# Patient Record
Sex: Female | Born: 1975
Health system: Southern US, Community
[De-identification: ages and names within clinical notes are randomized; demographics above are authoritative.]

## PROBLEM LIST (undated history)

## (undated) DIAGNOSIS — T7840XA Allergy, unspecified, initial encounter: Secondary | ICD-10-CM

## (undated) DIAGNOSIS — N200 Calculus of kidney: Secondary | ICD-10-CM

## (undated) DIAGNOSIS — M5030 Other cervical disc degeneration, unspecified cervical region: Secondary | ICD-10-CM

## (undated) DIAGNOSIS — K219 Gastro-esophageal reflux disease without esophagitis: Secondary | ICD-10-CM

## (undated) DIAGNOSIS — K921 Melena: Secondary | ICD-10-CM

## (undated) DIAGNOSIS — G43909 Migraine, unspecified, not intractable, without status migrainosus: Secondary | ICD-10-CM

## (undated) DIAGNOSIS — Z8619 Personal history of other infectious and parasitic diseases: Secondary | ICD-10-CM

## (undated) HISTORY — PX: TUBAL LIGATION: SHX77

## (undated) HISTORY — PX: ENDOVENOUS ABLATION SAPHENOUS VEIN W/ LASER: SUR449

## (undated) HISTORY — DX: Personal history of other infectious and parasitic diseases: Z86.19

## (undated) HISTORY — DX: Melena: K92.1

## (undated) HISTORY — DX: Migraine, unspecified, not intractable, without status migrainosus: G43.909

## (undated) HISTORY — DX: Allergy, unspecified, initial encounter: T78.40XA

## (undated) HISTORY — DX: Gastro-esophageal reflux disease without esophagitis: K21.9

## (undated) HISTORY — DX: Calculus of kidney: N20.0

## (undated) HISTORY — DX: Other cervical disc degeneration, unspecified cervical region: M50.30

---

## 2014-09-12 LAB — HM PAP SMEAR: HM PAP: NORMAL

## 2015-01-16 ENCOUNTER — Encounter: Payer: Self-pay | Admitting: Family Medicine

## 2015-01-16 ENCOUNTER — Ambulatory Visit (INDEPENDENT_AMBULATORY_CARE_PROVIDER_SITE_OTHER): Payer: BLUE CROSS/BLUE SHIELD | Admitting: Family Medicine

## 2015-01-16 VITALS — BP 118/80 | HR 92 | Temp 97.8°F | Ht 63.25 in | Wt 160.5 lb

## 2015-01-16 DIAGNOSIS — Z7189 Other specified counseling: Secondary | ICD-10-CM | POA: Diagnosis not present

## 2015-01-16 DIAGNOSIS — N926 Irregular menstruation, unspecified: Secondary | ICD-10-CM

## 2015-01-16 DIAGNOSIS — K649 Unspecified hemorrhoids: Secondary | ICD-10-CM | POA: Diagnosis not present

## 2015-01-16 DIAGNOSIS — J309 Allergic rhinitis, unspecified: Secondary | ICD-10-CM

## 2015-01-16 DIAGNOSIS — Z7689 Persons encountering health services in other specified circumstances: Secondary | ICD-10-CM

## 2015-01-16 MED ORDER — HYDROCORTISONE 2.5 % RE CREA: 1.0000 "application " | TOPICAL_CREAM | Freq: Two times a day (BID) | RECTAL | Status: DC

## 2015-01-16 NOTE — Progress Notes (Signed)
Pre visit review using our clinic review tool, if applicable. No additional management support is needed unless otherwise documented below in the visit note. 

## 2015-01-16 NOTE — Progress Notes (Signed)
HPI:  Danielle Reyes is here to establish care.  Last PCP and physical: sees Danielle Reyes in gyn for annual exam.  Has the following chronic problems that require follow up and concerns today:  Hemorrhoids: -she has had 4 children and has had mild symptoms in the past -flare started 1 week ago -painful hemorrhoid with some bleeding -she has had mild constipation from time to time -tried preparation H yesterday  Allergic Rhinitis: -chronic -takes claritin -nasal congestion, pnd, sneezing -denies: wheezing, asthma  ROS negative for unless reported above: fevers, unintentional weight loss, hearing or vision loss, chest pain, palpitations, struggling to breath, hemoptysis, melena, hematochezia, hematuria, falls, loc, si, thoughts of self harm  Past Medical History  Diagnosis Date  . Blood in stool     hemorrhoids  . History of chicken pox   . Allergy   . GERD (gastroesophageal reflux disease)   . Kidney stones   . Migraines     Past Surgical History  Procedure Laterality Date  . Tubal ligation    . Cesarean section    . Endovenous ablation saphenous vein w/ laser Left     Family History  Problem Relation Age of Onset  . Alcoholism Maternal Grandfather   . Heart disease Paternal Grandfather   . Kidney disease Mother   . Mental illness Father     History   Social History  . Marital Status: Married    Spouse Name: N/A  . Number of Children: N/A  . Years of Education: N/A   Social History Main Topics  . Smoking status: Never Smoker   . Smokeless tobacco: Not on file  . Alcohol Use: Not on file  . Drug Use: Not on file  . Sexual Activity: Not on file   Other Topics Concern  . None   Social History Narrative   Work or School: homemaker      Home Situation: lives with 4 kids 20, 14, 10, 6 and husband      Spiritual Beliefs: Christian       Lifestyle: doing elliptical, 10,000 steps, watching diet           Current outpatient prescriptions:  .   hydrocortisone (ANUSOL-HC) 2.5 % rectal cream, Place 1 application rectally 2 (two) times daily., Disp: 30 g, Rfl: 0 .  [facility] 1/[facility] 1-35 MG-MCG tablet, Take 1 tablet by mouth daily., Disp: , Rfl: 3  EXAM:  Filed Vitals:   01/16/15 0823  BP: 118/80  Pulse: 92  Temp: 97.8 F (36.6 C)    Body mass index is 28.19 kg/(m^2).  GENERAL: vitals reviewed and listed above, alert, oriented, appears well hydrated and in no acute distress  HEENT: atraumatic, conjunttiva clear, no obvious abnormalities on inspection of external nose and ears  NECK: no obvious masses on inspection  LUNGS: clear to auscultation bilaterally, no wheezes, rales or rhonchi, good air movement  CV: HRRR, no peripheral edema  RECTAL: declined  MS: moves all extremities without noticeable abnormality  PSYCH: pleasant and cooperative, no obvious depression or anxiety  ASSESSMENT AND PLAN:  Discussed the following assessment and plan:  Hemorrhoids, unspecified hemorrhoid type - Plan: hydrocortisone (ANUSOL-HC) 2.5 % rectal cream -she declined exam today, is going to try anusol, sitz baths and keeping stool soft, agreed to f/u if persists or worsens  Menstrual periods irregular -sees gyn  Allergic rhinitis, unspecified allergic rhinitis type  Encounter to establish care -We reviewed the PMH, PSH, FH, SH, Meds and Allergies. -We provided refills  for any medications we will prescribe as needed. -We addressed current concerns per orders and patient instructions. -We have asked for records for pertinent exams, studies, vaccines and notes from previous providers. -We have advised patient to follow up per instructions below.  Mentioned back pain as leaving - chronic, mild, sees chiropractor. HEP given and advised to RTC if not improving/resolved in 1 month or worsening.  -Patient advised to return or notify a doctor immediately if symptoms worsen or persist or new concerns arise.  There are no Patient  Instructions on file for this visit.   Kriste Basque R.

## 2015-01-16 NOTE — Patient Instructions (Signed)
BEFORE YOU LEAVE: -CPE in 3 months - come fasting  For the hemorhoids - we sent a cream to your pharmacy - please call if symptoms persist in 2 weeks or if worsening or concerns. Keep bowel soft - use mirilax once daily if needed and a daily fiber supplement.  We recommend the following healthy lifestyle measures: - eat a healthy diet consisting of lots of vegetables, fruits, beans, nuts, seeds, healthy meats such as white chicken and fish and whole grains.  - avoid fried foods, fast food, processed foods, sodas, red meet and other fattening foods.  - get a least 150 minutes of aerobic exercise per week.   About Hemorrhoids  Hemorrhoids are swollen veins in the lower rectum and anus.  Also called piles, hemorrhoids are a common problem.  Hemorrhoids may be internal (inside the rectum) or external (around the anus).  Internal Hemorrhoids  Internal hemorrhoids are often painless, but they rarely cause bleeding.  The internal veins may stretch and fall down (prolapse) through the anus to the outside of the body.  The veins may then become irritated and painful.  External Hemorrhoids  External hemorrhoids can be easily seen or felt around the anal opening.  They are under the skin around the anus.  When the swollen veins are scratched or broken by straining, rubbing or wiping they sometimes bleed.  How Hemorrhoids Occur  Veins in the rectum and around the anus tend to swell under pressure.  Hemorrhoids can result from increased pressure in the veins of your anus or rectum.  Some sources of pressure are:   Straining to have a bowel movement because of constipation  Waiting too long to have a bowel movement  Coughing and sneezing often  Sitting for extended periods of time, including on the toilet  Diarrhea  Obesity  Trauma or injury to the anus  Some liver diseases  Stress  Family history of hemorrhoids  Pregnancy  Pregnant women should try to avoid becoming constipated,  because they are more likely to have hemorrhoids during pregnancy.  In the last trimester of pregnancy, the enlarged uterus may press on blood vessels and causes hemorrhoids.  In addition, the strain of childbirth sometimes causes hemorrhoids after the birth.  Symptoms of Hemorrhoids  Some symptoms of hemorrhoids include:  Swelling and/or a tender lump around the anus  Itching, mild burning and bleeding around the anus  Painful bowel movements with or without constipation  Bright red blood covering the stool, on toilet paper or in the toilet bowel.   Symptoms usually go away within a few days.  Always talk to your doctor about any bleeding to make sure it is not from some other causes.  Diagnosing and Treating Hemorrhoids  Diagnosis is made by an examination by your healthcare provider.  Special test can be performed by your doctor.    Most cases of hemorrhoids can be treated with:  High-fiber diet: Eat more high-fiber foods, which help prevent constipation.  Ask for more detailed fiber information on types and sources of fiber from your healthcare provider.  Fluids: Drink plenty of water.  This helps soften bowel movements so they are easier to pass.  Sitz baths and cold packs: Sitting in lukewarm water two or three times a day for 15 minutes cleases the anal area and may relieve discomfort.  If the water is too hot, swelling around the anus will get worse.  Placing a cloth-covered ice pack on the anus for ten minutes four times  a day can also help reduce selling.  Gently pushing a prolapsed hemorrhoid back inside after the bath or ice pack can be helpful.  Medications: For mild discomfort, your healthcare provider may suggest over-the-counter pain medication or prescribe a cream or ointment for topical use.  The cream may contain witch hazel, zinc oxide or petroleum jelly.  Medicated suppositories are also a treatment option.  Always consult your doctor before applying medications or  creams.  Procedures and surgeries: There are also a number of procedures and surgeries to shrink or remove hemorrhoids in more serious cases.  Talk to your physician about these options.  You can often prevent hemorrhoids or keep them from becoming worse by maintaining a healthy lifestyle.  Eat a fiber-rich diet of fruits, vegetables and whole grains.  Also, drink plenty of water and exercise regularly.   2007, [employer] Doc.30

## 2015-01-18 ENCOUNTER — Ambulatory Visit (INDEPENDENT_AMBULATORY_CARE_PROVIDER_SITE_OTHER): Payer: BLUE CROSS/BLUE SHIELD | Admitting: Family Medicine

## 2015-01-18 ENCOUNTER — Encounter: Payer: Self-pay | Admitting: Family Medicine

## 2015-01-18 VITALS — BP 102/60 | HR 77 | Temp 97.5°F | Ht 63.25 in | Wt 157.0 lb

## 2015-01-18 DIAGNOSIS — M542 Cervicalgia: Secondary | ICD-10-CM

## 2015-01-18 MED ORDER — CYCLOBENZAPRINE HCL 5 MG PO TABS: 5.0000 mg | ORAL_TABLET | Freq: Every evening | ORAL | Status: AC | PRN
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
## 2015-01-18 NOTE — Patient Instructions (Signed)
BEFORE YOU LEAVE: -neck spasm exercises -follow up in 1 month; sooner if worsening or other concerns -xray sheet  Do the exercises at least 4 days per week  Use the muscle relaxer (flexeril) once nightly before bed for 5-7 days  Tylenol 500-1000mg  up to 3 times per day

## 2015-01-18 NOTE — Progress Notes (Signed)
Pre visit review using our clinic review tool, if applicable. No additional management support is needed unless otherwise documented below in the visit note. 

## 2015-01-20 ENCOUNTER — Encounter: Payer: Self-pay | Admitting: Family Medicine

## 2015-01-21 ENCOUNTER — Other Ambulatory Visit: Payer: Self-pay | Admitting: Family Medicine

## 2015-01-21 ENCOUNTER — Ambulatory Visit (INDEPENDENT_AMBULATORY_CARE_PROVIDER_SITE_OTHER)
Admission: RE | Admit: 2015-01-21 | Discharge: 2015-01-21 | Disposition: A | Discharge status: Home / Self Care | Payer: BLUE CROSS/BLUE SHIELD | Source: Ambulatory Visit | Attending: Family Medicine | Admitting: Family Medicine

## 2015-01-21 DIAGNOSIS — M542 Cervicalgia: Secondary | ICD-10-CM

## 2015-01-21 DIAGNOSIS — M5030 Other cervical disc degeneration, unspecified cervical region: Secondary | ICD-10-CM

## 2015-01-21 IMAGING — CR DG CERVICAL SPINE COMPLETE 4+V
5 series · 5 of 5 positions shown · non-contrast
Comparison: None

CLINICAL DATA: Neck pain

EXAM:
CERVICAL SPINE  4+ VIEWS

[view not recorded (1 of 5)]
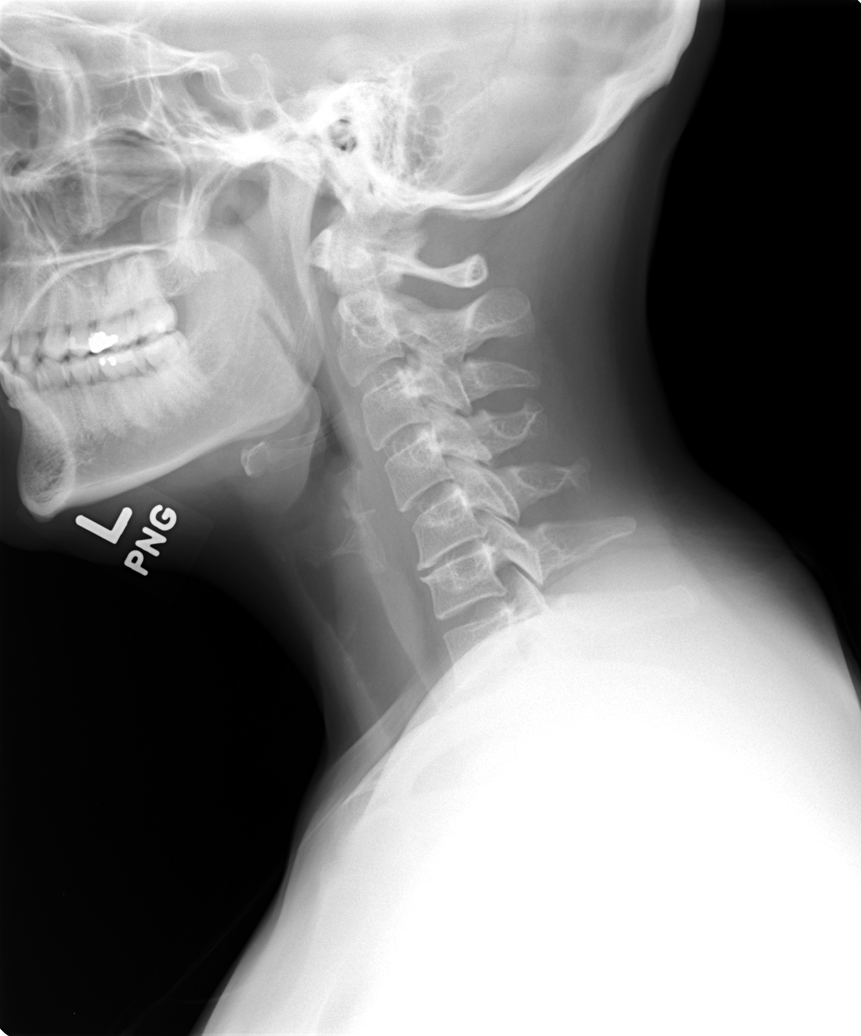

[view not recorded (2 of 5)]
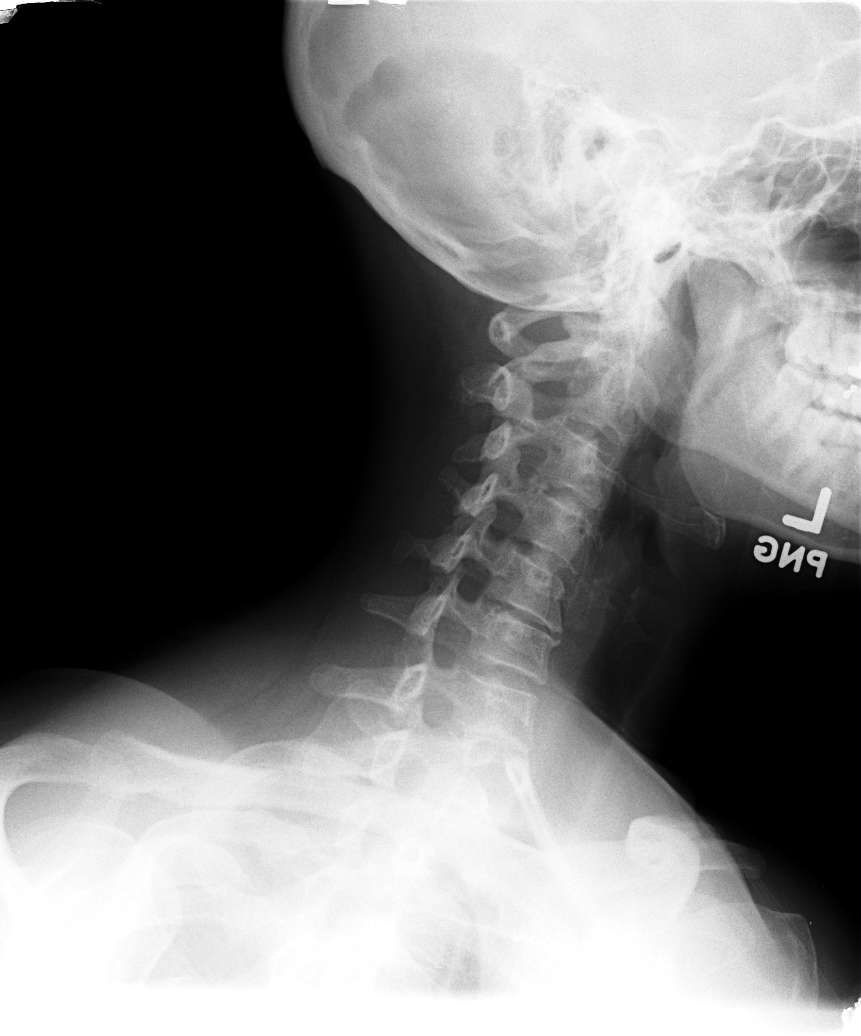

[view not recorded (3 of 5)]
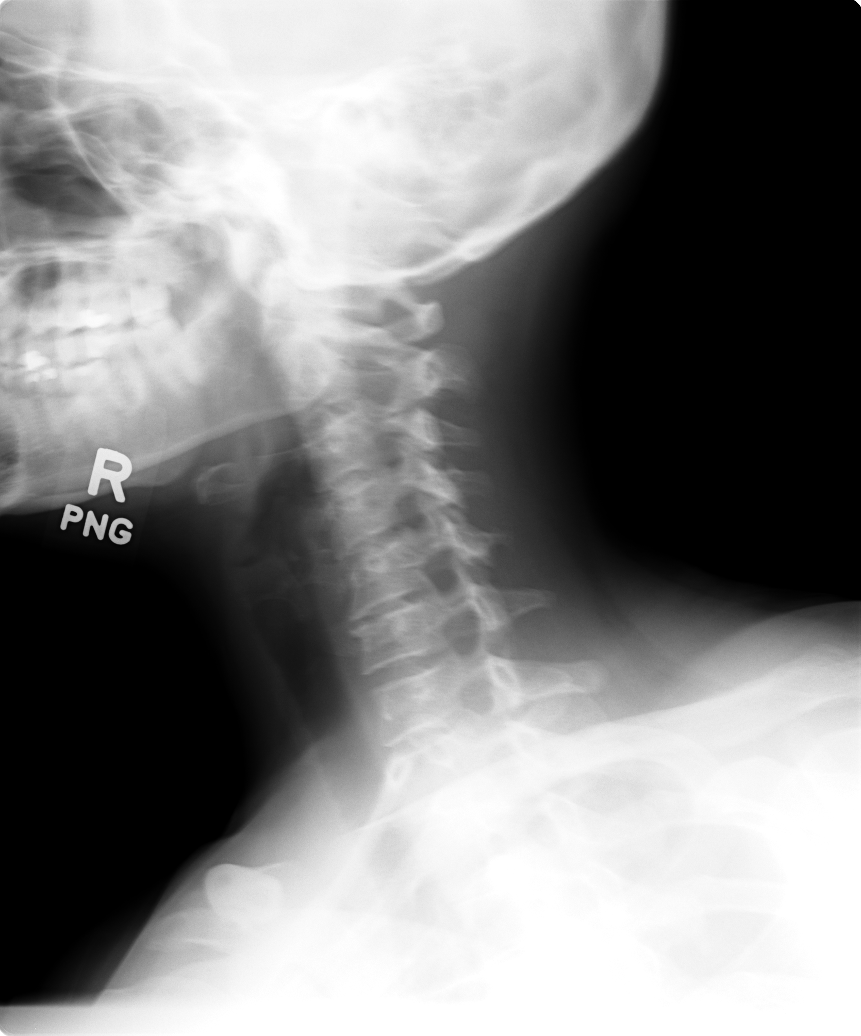

[view not recorded (4 of 5)]
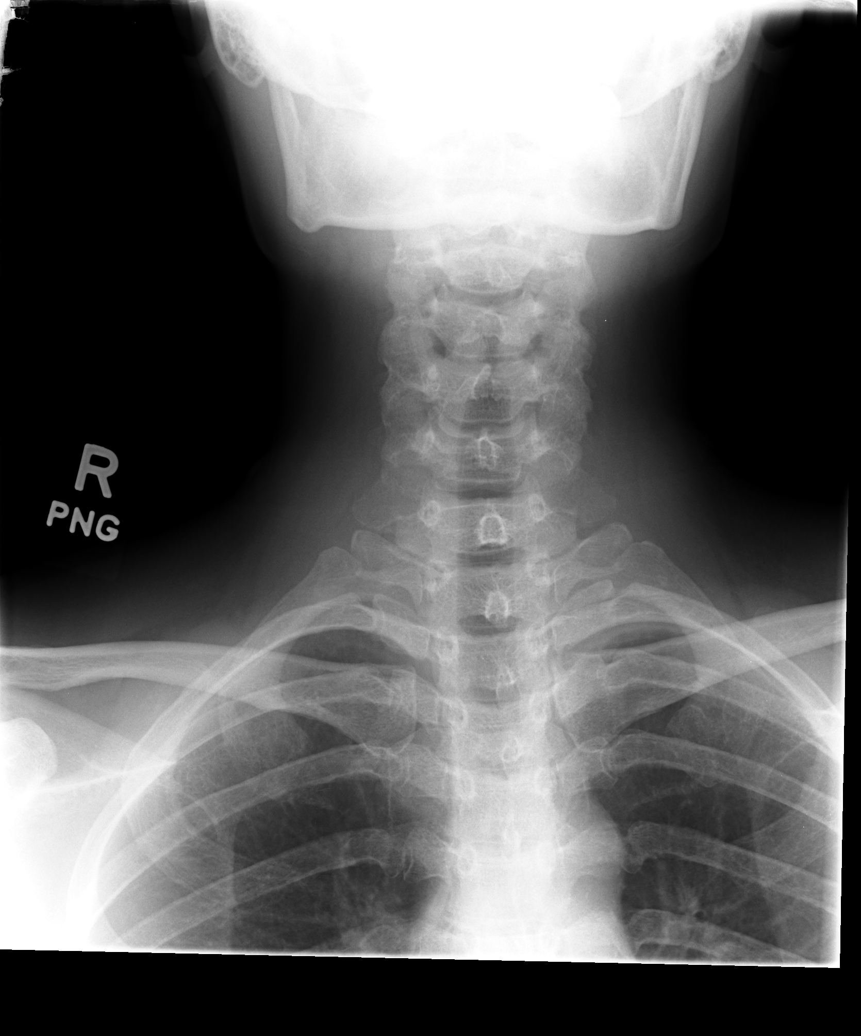

[view not recorded (5 of 5)]
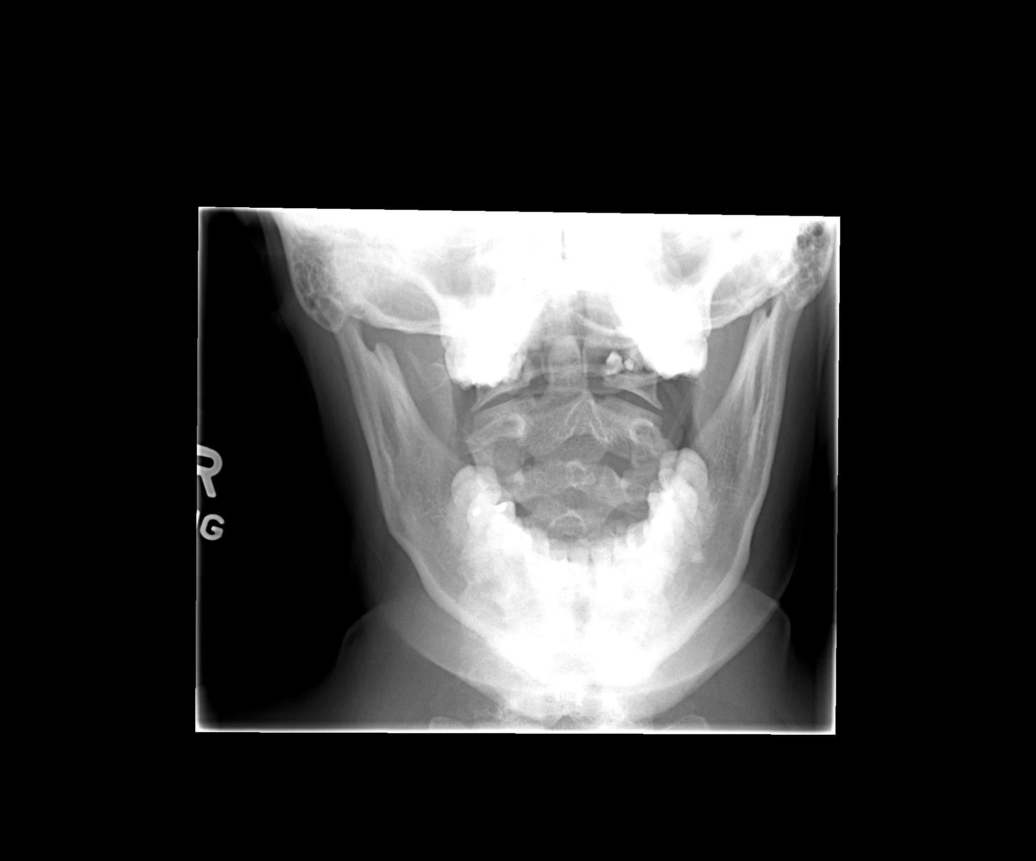

[5 of 5 positions shown; findings below may reference images not displayed]

FINDINGS: Reversal of cervical lordosis question muscle spasm.

Prevertebral soft tissues normal thickness.

Osseous mineralization grossly normal.

Disc space narrowing with endplate spur formation C5-C6.

Vertebral body and disc space heights otherwise maintained.

No acute fracture, subluxation or bone destruction.

Motion artifacts degrade RPO view.

Lung apices clear.

C1-C2 alignment normal.
IMPRESSION: Degenerative disc disease changes C5-C6.

Question muscle spasm.

Otherwise negative exam.

## 2015-01-21 NOTE — Progress Notes (Signed)
[phone] (home)  Discussed results xray with pt, [facility] - not [facility] better, not worse. No weakness or numbness, but does have radiating pain. Likely mild cervical radiculopathy. Discussed tx options for this. She wants referral to specialist. Sent. Also discussed urgent and emergency precautions.

## 2015-01-22 ENCOUNTER — Encounter: Payer: Self-pay | Admitting: Family Medicine

## 2015-01-23 ENCOUNTER — Encounter: Payer: Self-pay | Admitting: Family Medicine

## 2015-02-10 HISTORY — PX: CERVICAL SPINE SURGERY: SHX589

## 2015-02-18 ENCOUNTER — Ambulatory Visit: Payer: BLUE CROSS/BLUE SHIELD | Admitting: Family Medicine

## 2015-03-26 ENCOUNTER — Ambulatory Visit: Payer: Self-pay | Admitting: Family Medicine

## 2015-04-30 ENCOUNTER — Encounter: Payer: Self-pay | Admitting: Family Medicine

## 2015-04-30 ENCOUNTER — Ambulatory Visit (INDEPENDENT_AMBULATORY_CARE_PROVIDER_SITE_OTHER): Payer: BLUE CROSS/BLUE SHIELD | Admitting: Family Medicine

## 2015-04-30 VITALS — BP 110/80 | HR 112 | Temp 98.4°F | Ht 63.5 in | Wt 167.2 lb

## 2015-04-30 DIAGNOSIS — E663 Overweight: Secondary | ICD-10-CM | POA: Diagnosis not present

## 2015-04-30 DIAGNOSIS — Z0000 Encounter for general adult medical examination without abnormal findings: Secondary | ICD-10-CM

## 2015-04-30 NOTE — Progress Notes (Signed)
Pre visit review using our clinic review tool, if applicable. No additional management support is needed unless otherwise documented below in the visit note. 

## 2015-04-30 NOTE — Patient Instructions (Signed)
BEFORE YOU LEAVE: - schedule lab appointment -flu shot in the late summer or early fall - follow up yearly  -We have ordered labs or studies at this visit. It can take up to 1-2 weeks for results and processing. We will contact you with instructions IF your results are abnormal. Normal results will be released to your [facility]. If you have not heard from us or can not find your results in [facility] in 2 weeks please contact our office.  We recommend the following healthy lifestyle measures: - eat a healthy diet consisting of lots of vegetables, fruits, beans, nuts, seeds, healthy meats such as white chicken and fish and whole grains.  - avoid fried foods, fast food, processed foods, sodas, red meet and other fattening foods.  - get a least 150 minutes of aerobic exercise per week.

## 2015-04-30 NOTE — Progress Notes (Signed)
HPI:  Here for CPE:  -Concerns and/or follow up today: none  -Diet: variety of foods, balance and well rounded, larger portion sizes  -Exercise: no regular exercise  -Taking folic acid, vitamin D or calcium: no  -Diabetes and Dyslipidemia Screening: Not Fasting today  -Hx of HTN: no  -Vaccines: UTD  -pap history: sees Merry Lofty yearly for gyn physical and breast exam  -FDLMP: normal, regular  -sexual activity: yes, female partner, no new partners  -wants STI testing (Hep C if born [birth year]):  no  -FH breast, colon or ovarian ca: see FH Last mammogram: n/a Last colon cancer screening: n/a  -Alcohol, Tobacco, drug use: see social history  Review of Systems - no fevers, unintentional weight loss, vision loss, hearing loss, chest pain, sob, hemoptysis, melena, hematochezia, hematuria, genital discharge, changing or concerning skin lesions, bleeding, bruising, loc, thoughts of self harm or SI  Past Medical History  Diagnosis Date  . Blood in stool     hemorrhoids  . History of chicken pox   . Allergy   . GERD (gastroesophageal reflux disease)   . Kidney stones   . Migraines   . DDD (degenerative disc disease), cervical     s/p surgery in 2016 with Dr. Dutch Quint    Past Surgical History  Procedure Laterality Date  . Tubal ligation    . Cesarean section    . Endovenous ablation saphenous vein w/ laser Left   . Cervical spine surgery  02/2015    Dr Tomasa Blase patient    Family History  Problem Relation Age of Onset  . Alcoholism Maternal Grandfather   . Heart disease Paternal Grandfather   . Kidney disease Mother   . Mental illness Father     History   Social History  . Marital Status: Married    Spouse Name: N/A  . Number of Children: N/A  . Years of Education: N/A   Social History Main Topics  . Smoking status: Never Smoker   . Smokeless tobacco: Not on file  . Alcohol Use: Not on file  . Drug Use: Not on file  . Sexual Activity: Not on file   Other  Topics Concern  . None   Social History Narrative   Work or School: homemaker      Home Situation: lives with 4 kids 20, 14, 10, 6 and husband      Spiritual Beliefs: Christian       Lifestyle: doing elliptical, 10,000 steps, watching diet           Current outpatient prescriptions:  .  cyclobenzaprine (FLEXERIL) 5 MG tablet, Take 1 tablet (5 mg total) by mouth at bedtime as needed for muscle spasms., Disp: 20 tablet, Rfl: 0 .  diclofenac (VOLTAREN) 75 MG EC tablet, TAKE 1 TABLET BY MOUTH TWICE A DAY FOR 2 WEEKS WITH FOOD THEN AS NEEDED, Disp: , Rfl: 2 .  [facility] 1/35 1-35 MG-MCG tablet, Take 1 tablet by mouth daily., Disp: , Rfl: 3  EXAM:  Filed Vitals:   04/30/15 1117  BP: 110/80  Pulse: 112  Temp: 98.4 F (36.9 C)   Body mass index is 29.15 kg/(m^2).  GENERAL: vitals reviewed and listed below, alert, oriented, appears well hydrated and in no acute distress  HEENT: head atraumatic, PERRLA, normal appearance of eyes, ears, nose and mouth. moist mucus membranes.  NECK: supple, no masses or lymphadenopathy  LUNGS: clear to auscultation bilaterally, no rales, rhonchi or wheeze  CV: HRRR, no peripheral edema or cyanosis,  normal pedal pulses  BREAST: declined - does with gyn  ABDOMEN: bowel sounds normal, soft, non tender to palpation, no masses, no rebound or guarding  GU: declined - does with gyn  SKIN: no rash or abnormal lesions  MS: normal gait, moves all extremities normally  NEURO: CN II-XII grossly intact, normal muscle strength and sensation to light touch on extremities  PSYCH: normal affect, pleasant and cooperative  ASSESSMENT AND PLAN:  Discussed the following assessment and plan:  There are no diagnoses linked to this encounter.  -Discussed and advised all [place] preventive services health task force level A and B recommendations for age, sex and risks.  -Advised at least 150 minutes of exercise per week and a healthy diet low in saturated fats and  sweets and consisting of fresh fruits and vegetables, lean meats such as fish and white chicken and whole grains.  -labs, studies and vaccines per orders this encounter  Orders Placed This Encounter  Procedures  . Lipid Panel    Standing Status: Future     Number of Occurrences:      Standing Expiration Date: 06/25/2015  . Hemoglobin A1C    Standing Status: Future     Number of Occurrences:      Standing Expiration Date: 06/25/2015  . TSH    Standing Status: Future     Number of Occurrences:      Standing Expiration Date: 05/28/2015    Patient advised to return to clinic immediately if symptoms worsen or persist or new concerns.  Patient Instructions  BEFORE YOU LEAVE: - schedule lab appointment -flu shot in the late summer or early fall - follow up yearly  -We have ordered labs or studies at this visit. It can take up to 1-2 weeks for results and processing. We will contact you with instructions IF your results are abnormal. Normal results will be released to your [facility]. If you have not heard from us or can not find your results in [facility] in 2 weeks please contact our office.  We recommend the following healthy lifestyle measures: - eat a healthy diet consisting of lots of vegetables, fruits, beans, nuts, seeds, healthy meats such as white chicken and fish and whole grains.  - avoid fried foods, fast food, processed foods, sodas, red meet and other fattening foods.  - get a least 150 minutes of aerobic exercise per week.      No Follow-up on file.  Kriste BasqueKIM, Jessia Kief R.

## 2015-05-08 ENCOUNTER — Other Ambulatory Visit (INDEPENDENT_AMBULATORY_CARE_PROVIDER_SITE_OTHER): Payer: BLUE CROSS/BLUE SHIELD

## 2015-05-08 DIAGNOSIS — E663 Overweight: Secondary | ICD-10-CM

## 2015-05-08 DIAGNOSIS — Z0000 Encounter for general adult medical examination without abnormal findings: Secondary | ICD-10-CM

## 2015-05-08 LAB — LIPID PANEL
CHOL/HDL RATIO: 4
CHOLESTEROL: 212 mg/dL — AB (ref 0–200)
HDL: 49.3 mg/dL (ref >39.00)
LDL Cholesterol: 135 mg/dL — ABNORMAL HIGH (ref 0–99)
NonHDL: 162.7
TRIGLYCERIDES: 138 mg/dL (ref 0.0–149.0)
VLDL: 27.6 mg/dL (ref 0.0–40.0)

## 2015-05-08 LAB — HEMOGLOBIN A1C: Hgb A1c MFr Bld: 5.2 % (ref 4.6–6.5)

## 2015-05-08 LAB — TSH: TSH: 2.49 u[IU]/mL (ref 0.35–4.50)

## 2015-06-29 ENCOUNTER — Encounter: Payer: Self-pay | Admitting: Family Medicine

## 2015-09-16 ENCOUNTER — Other Ambulatory Visit: Payer: Self-pay | Admitting: Physical Medicine and Rehabilitation

## 2015-09-16 DIAGNOSIS — M47819 Spondylosis without myelopathy or radiculopathy, site unspecified: Secondary | ICD-10-CM

## 2015-09-16 DIAGNOSIS — G8929 Other chronic pain: Secondary | ICD-10-CM

## 2015-09-16 DIAGNOSIS — M545 Low back pain, unspecified: Secondary | ICD-10-CM

## 2015-09-30 ENCOUNTER — Other Ambulatory Visit: Payer: BLUE CROSS/BLUE SHIELD

## 2015-10-11 ENCOUNTER — Ambulatory Visit
Admission: RE | Admit: 2015-10-11 | Discharge: 2015-10-11 | Disposition: A | Discharge status: Home / Self Care | Payer: BLUE CROSS/BLUE SHIELD | Source: Ambulatory Visit | Attending: Physical Medicine and Rehabilitation | Admitting: Physical Medicine and Rehabilitation

## 2015-10-11 DIAGNOSIS — G8929 Other chronic pain: Secondary | ICD-10-CM

## 2015-10-11 DIAGNOSIS — M545 Low back pain, unspecified: Secondary | ICD-10-CM

## 2015-10-11 DIAGNOSIS — M47819 Spondylosis without myelopathy or radiculopathy, site unspecified: Secondary | ICD-10-CM

## 2015-10-11 MED ORDER — METHYLPREDNISOLONE ACETATE 40 MG/ML INJ SUSP (RADIOLOG
120.0000 mg | Freq: Once | INTRAMUSCULAR | Status: AC
  Administered 2015-10-11: 120 mg via EPIDURAL

## 2015-10-11 MED ORDER — IOHEXOL 180 MG/ML  SOLN
1.0000 mL | Freq: Once | INTRAMUSCULAR | Status: AC | PRN
  Administered 2015-10-11: 1 mL via EPIDURAL

## 2015-10-11 NOTE — Discharge Instructions (Signed)

## 2015-10-28 ENCOUNTER — Ambulatory Visit (INDEPENDENT_AMBULATORY_CARE_PROVIDER_SITE_OTHER): Payer: BLUE CROSS/BLUE SHIELD | Admitting: Family Medicine

## 2015-10-28 ENCOUNTER — Encounter: Payer: Self-pay | Admitting: Family Medicine

## 2015-10-28 VITALS — BP 102/70 | HR 100 | Temp 97.8°F | Ht 63.5 in | Wt 171.3 lb

## 2015-10-28 DIAGNOSIS — J0100 Acute maxillary sinusitis, unspecified: Secondary | ICD-10-CM

## 2015-10-28 DIAGNOSIS — J309 Allergic rhinitis, unspecified: Secondary | ICD-10-CM

## 2015-10-28 DIAGNOSIS — R040 Epistaxis: Secondary | ICD-10-CM

## 2015-10-28 DIAGNOSIS — Z23 Encounter for immunization: Secondary | ICD-10-CM | POA: Diagnosis not present

## 2015-10-28 MED ORDER — DOXYCYCLINE HYCLATE 100 MG PO CAPS: 100.0000 mg | ORAL_CAPSULE | Freq: Two times a day (BID) | ORAL | Status: DC

## 2015-10-28 MED ORDER — FLUCONAZOLE 150 MG PO TABS: 150.0000 mg | ORAL_TABLET | Freq: Once | ORAL | Status: DC

## 2015-10-28 NOTE — Progress Notes (Signed)
Pre visit review using our clinic review tool, if applicable. No additional management support is needed unless otherwise documented below in the visit note. 

## 2015-10-28 NOTE — Patient Instructions (Signed)
Please take the antibiotic as instructed  Align and Culturelle are good probiotics  Please use nasal saline twice daily and a humidifier at night  When you resume the flonase please ensure you are not praying towards your septum (the middle of the nose)  Follow up as needed

## 2015-10-28 NOTE — Progress Notes (Signed)
HPI:  Acute visit for:  Sinus congestion: -chronic clear rhinorrhea associated with her allergies -reports takes claritin and flonase for this -over the last week, worsening symptoms with sinus pain and some blood in nasal mucus -stopped her flonase and is using nasal saline and nose bleeds improving -denies: fevers, chills, SOB, DOE, tooth pain   ROS: See pertinent positives and negatives per HPI.  Past Medical History  Diagnosis Date  . Blood in stool     hemorrhoids  . History of chicken pox   . Allergy   . GERD (gastroesophageal reflux disease)   . Kidney stones   . Migraines   . DDD (degenerative disc disease), cervical     s/p surgery in 2016 with Danielle Reyes    Past Surgical History  Procedure Laterality Date  . Tubal ligation    . Cesarean section    . Endovenous ablation saphenous vein w/ laser Left   . Cervical spine surgery  02/2015    Danielle Reyes patient    Family History  Problem Relation Age of Onset  . Alcoholism Maternal Grandfather   . Heart disease Paternal Grandfather   . Kidney disease Mother   . Mental illness Father     Social History   Social History  . Marital Status: Married    Spouse Name: N/A  . Number of Children: N/A  . Years of Education: N/A   Social History Main Topics  . Smoking status: Never Smoker   . Smokeless tobacco: None  . Alcohol Use: None  . Drug Use: None  . Sexual Activity: Not Asked   Other Topics Concern  . None   Social History Narrative   Work or School: homemaker      Home Situation: lives with 4 kids 20, 14, 10, 6 and husband      Spiritual Beliefs: Christian       Lifestyle: doing elliptical, 10,000 steps, watching diet           Current outpatient prescriptions:  .  cyclobenzaprine (FLEXERIL) 5 MG tablet, Take 1 tablet (5 mg total) by mouth at bedtime as needed for muscle spasms., Disp: 20 tablet, Rfl: 0 .  Danielle Reyes 1/35 1-35 MG-MCG tablet, Take 1 tablet by mouth daily., Disp: , Rfl: 3 .   Loratadine (CLARITIN PO), Take by mouth., Disp: , Rfl:  .  doxycycline (VIBRAMYCIN) 100 MG capsule, Take 1 capsule (100 mg total) by mouth 2 (two) times daily., Disp: 20 capsule, Rfl: 0 .  fluconazole (DIFLUCAN) 150 MG tablet, Take 1 tablet (150 mg total) by mouth once. May repeat in 1 week if needed., Disp: 2 tablet, Rfl: 0  EXAM:  Filed Vitals:   10/28/15 1434  BP: 102/70  Pulse: 100  Temp: 97.8 F (36.6 C)    Body mass index is 29.86 kg/(m^2).  GENERAL: vitals reviewed and listed above, alert, oriented, appears well hydrated and in no acute distress  HEENT: atraumatic, conjunttiva clear, no obvious abnormalities on inspection of external nose and ears, normal appearance of ear canals and TMs, Thick white nasal congestion on L, boggy turbinates bilat, mild post oropharyngeal erythema with PND, no tonsillar edema or exudate, no sinus TTP  NECK: no obvious masses on inspection  LUNGS: clear to auscultation bilaterally, no wheezes, rales or rhonchi, good air movement  CV: HRRR, no peripheral edema  MS: moves all extremities without noticeable abnormality  PSYCH: pleasant and cooperative, no obvious depression or anxiety  ASSESSMENT AND PLAN:  Discussed the following  assessment and plan:  Acute maxillary sinusitis, recurrence not specified  Allergic rhinitis, unspecified allergic rhinitis type  Bleeding from the nose  -opted for abx for likely sinusitis given symptoms and exam -discussed proper positioning of flonase when resumes -follow up as needed -reports always needs diflucan if take abx as OTC options don't work the the yeast vaginitis she gets whenever takes abx -Patient advised to return or notify a doctor immediately if symptoms worsen or persist or new concerns arise.  Patient Instructions  Please take the antibiotic as instructed  Align and Culturelle are good probiotics  Please use nasal saline twice daily and a humidifier at night  When you resume the  flonase please ensure you are not praying towards your septum (the middle of the nose)  Follow up as needed     Danielle Reyes R.

## 2015-12-31 ENCOUNTER — Encounter: Payer: Self-pay | Admitting: Family Medicine

## 2016-01-17 ENCOUNTER — Other Ambulatory Visit: Payer: Self-pay | Admitting: Family Medicine

## 2016-01-17 ENCOUNTER — Telehealth: Payer: BLUE CROSS/BLUE SHIELD | Admitting: Family

## 2016-01-17 DIAGNOSIS — B373 Candidiasis of vulva and vagina: Secondary | ICD-10-CM

## 2016-01-17 DIAGNOSIS — B3731 Acute candidiasis of vulva and vagina: Secondary | ICD-10-CM

## 2016-01-17 MED ORDER — FLUCONAZOLE 150 MG PO TABS: 150.0000 mg | ORAL_TABLET | Freq: Once | ORAL | Status: DC

## 2016-01-17 NOTE — Progress Notes (Signed)

## 2016-03-23 ENCOUNTER — Other Ambulatory Visit: Payer: Self-pay | Admitting: Family Medicine

## 2016-04-02 DIAGNOSIS — N898 Other specified noninflammatory disorders of vagina: Secondary | ICD-10-CM | POA: Diagnosis not present

## 2016-04-02 DIAGNOSIS — Z124 Encounter for screening for malignant neoplasm of cervix: Secondary | ICD-10-CM | POA: Diagnosis not present

## 2016-04-02 DIAGNOSIS — N760 Acute vaginitis: Secondary | ICD-10-CM | POA: Diagnosis not present

## 2016-06-04 ENCOUNTER — Ambulatory Visit (INDEPENDENT_AMBULATORY_CARE_PROVIDER_SITE_OTHER): Payer: BLUE CROSS/BLUE SHIELD

## 2016-06-04 ENCOUNTER — Ambulatory Visit (INDEPENDENT_AMBULATORY_CARE_PROVIDER_SITE_OTHER): Payer: BLUE CROSS/BLUE SHIELD | Admitting: Podiatry

## 2016-06-04 ENCOUNTER — Encounter: Payer: Self-pay | Admitting: Podiatry

## 2016-06-04 VITALS — BP 114/82 | HR 91 | Resp 16 | Ht 63.0 in | Wt 165.0 lb

## 2016-06-04 DIAGNOSIS — M79672 Pain in left foot: Secondary | ICD-10-CM

## 2016-06-04 DIAGNOSIS — M79671 Pain in right foot: Secondary | ICD-10-CM

## 2016-06-04 DIAGNOSIS — L309 Dermatitis, unspecified: Secondary | ICD-10-CM

## 2016-06-04 NOTE — Progress Notes (Signed)
   Subjective:    Patient ID: Danielle Reyes, female    DOB: June 24, 1976, 40 y.o.   MRN: [id number]  HPI Chief Complaint  Patient presents with  . Skin Condition    Bilateral; dry-cracked skin on heels; pt stated, "Sometimes foot hurts"; x4 yrs  . Foot Pain    Bilateral; plantar forefoot; Left foot-Dorsal-below all toes; pt stated, "Sometimes big toe hurts on Left foot"; x2 months      Review of Systems  Musculoskeletal: Positive for back pain and myalgias.  Allergic/Immunologic: Positive for environmental allergies and food allergies.  Neurological: Positive for numbness and headaches.  All other systems reviewed and are negative.      Objective:   Physical Exam        Assessment & Plan:

## 2016-06-05 NOTE — Progress Notes (Signed)
Subjective:     Patient ID: Danielle Reyes, female   DOB: April 10, 1976, 40 y.o.   MRN: [id number]  HPI patient presents stating that her heels are cracked and they get sore and make it hard to walk comfortably on them. States it's been this way for a long time   Review of Systems  All other systems reviewed and are negative.      Objective:   Physical Exam  Constitutional: She is oriented to person, place, and time.  Cardiovascular: Intact distal pulses.   Musculoskeletal: Normal range of motion.  Neurological: She is oriented to person, place, and time.  Skin: Skin is warm.  Nursing note and vitals reviewed.  neurovascular status found to be intact with muscle strength adequate range of motion within normal limits with cracking of the heel region bilateral localized in nature with family history of condition with mother and grandmother having had the same problem     Assessment:     Dermatitis with skin hydration issues bilateral heels with no indication of infection    Plan:     Reviewed condition and recommended at this time Vaseline under occlusion and read by did term to try to hydrate the heels. Reappoint if symptoms persist

## 2016-08-03 DIAGNOSIS — Z23 Encounter for immunization: Secondary | ICD-10-CM | POA: Diagnosis not present

## 2016-10-29 ENCOUNTER — Encounter: Payer: Self-pay | Admitting: Family Medicine

## 2016-11-04 DIAGNOSIS — N898 Other specified noninflammatory disorders of vagina: Secondary | ICD-10-CM | POA: Diagnosis not present

## 2016-11-04 DIAGNOSIS — Z6831 Body mass index (BMI) 31.0-31.9, adult: Secondary | ICD-10-CM | POA: Diagnosis not present

## 2016-11-04 DIAGNOSIS — B373 Candidiasis of vulva and vagina: Secondary | ICD-10-CM | POA: Diagnosis not present

## 2016-11-04 DIAGNOSIS — L292 Pruritus vulvae: Secondary | ICD-10-CM | POA: Diagnosis not present

## 2016-11-11 NOTE — Progress Notes (Signed)
HPI:  Here for CPE:  -Concerns and/or follow up today:  PMH HLD Occ GERD when eats the wrong things No regular exercise. Feels diet is fairly healthy.  -Taking folic acid, vitamin D or calcium: no  -Diabetes and Dyslipidemia Screening: not fasting today  -Hx of HTN: no  -Vaccines: UTD  -pap history: sees [name] in gyn physical and breast exam - reports see her yearly and up today  -sexual activity: yes, female partner, no new partners  -wants STI testing (Hep C if born [id number]): no  -FH breast, colon or ovarian ca: see FH, sees gyn for women's health exams Last mammogram: n/a Last colon cancer screening: n/a   -Alcohol, Tobacco, drug use: see social history  Review of Systems - no fevers, unintentional weight loss, vision loss, hearing loss, chest pain, sob, hemoptysis, melena, hematochezia, hematuria, genital discharge, changing or concerning skin lesions, bleeding, bruising, loc, thoughts of self harm or SI  Past Medical History:  Diagnosis Date  . Allergy   . Blood in stool    hemorrhoids  . DDD (degenerative disc disease), cervical    s/p surgery in 2016 with Dr. Dutch QuintPoole  . GERD (gastroesophageal reflux disease)   . History of chicken pox   . Kidney stones   . Migraines     Past Surgical History:  Procedure Laterality Date  . CERVICAL SPINE SURGERY  02/2015   Dr Tomasa BlasePoole-per patient  . CESAREAN SECTION    . ENDOVENOUS ABLATION SAPHENOUS VEIN W/ LASER Left   . TUBAL LIGATION      Family History  Problem Relation Age of Onset  . Alcoholism Maternal Grandfather   . Heart disease Paternal Grandfather   . Kidney disease Mother   . Mental illness Father     Social History   Social History  . Marital status: Married    Spouse name: N/A  . Number of children: N/A  . Years of education: N/A   Social History Main Topics  . Smoking status: Never Smoker  . Smokeless tobacco: Never Used  . Alcohol use None  . Drug use: Unknown  . Sexual activity: Not  Asked   Other Topics Concern  . None   Social History Narrative   Work or School: homemaker      Home Situation: lives with 4 kids 20, 14, 10, 6 and husband      Spiritual Beliefs: Christian       Lifestyle: doing elliptical, 10,000 steps, watching diet           Current Outpatient Prescriptions:  .  cyclobenzaprine (FLEXERIL) 5 MG tablet, Take 1 tablet (5 mg total) by mouth at bedtime as needed for muscle spasms., Disp: 20 tablet, Rfl: 0 .  [facility] 1/[facility] 1-35 MG-MCG tablet, Take 1 tablet by mouth daily., Disp: , Rfl: 3 .  Loratadine (CLARITIN PO), Take by mouth., Disp: , Rfl:   EXAM:  Vitals:   11/12/16 1116  BP: 108/62  Pulse: 78  Temp: 98.5 F (36.9 C)    GENERAL: vitals reviewed and listed below, alert, oriented, appears well hydrated and in no acute distress  HEENT: head atraumatic, PERRLA, normal appearance of eyes, ears, nose and mouth. moist mucus membranes.  NECK: supple, no masses or lymphadenopathy  LUNGS: clear to auscultation bilaterally, no rales, rhonchi or wheeze  CV: HRRR, no peripheral edema or cyanosis, normal pedal pulses  ABDOMEN: bowel sounds normal, soft, non tender to palpation, no masses, no rebound or guarding  SKIN: no rash  or abnormal lesions  GU/BREAST: declined - does with gyn  MS: normal gait, moves all extremities normally  NEURO: normal gait, speech and thought processing grossly intact, muscle tone grossly intact throughout  PSYCH: normal affect, pleasant and cooperative  ASSESSMENT AND PLAN:  Discussed the following assessment and plan:  Encounter for preventive health examination - Plan: Hemoglobin A1c, Cholesterol, total, HDL cholesterol  Gastroesophageal reflux disease, esophagitis presence not specified   -Discussed and advised all [place] preventive services health task force level A and B recommendations for age, sex and risks.  -Advised at least 150 minutes of exercise per week and a healthy diet with avoidance of  (less then 1 serving per week) processed foods, white starches, red meat, fast foods and sweets and consisting of: * 5-9 servings of fresh fruits and vegetables (not corn or potatoes) *nuts and seeds, beans *olives and olive oil *lean meats such as fish and white chicken  *whole grains  -labs, studies and vaccines per orders this encounter  Orders Placed This Encounter  Procedures  . Hemoglobin A1c  . Cholesterol, total  . HDL cholesterol    Patient advised to return to clinic immediately if symptoms worsen or persist or new concerns.  Patient Instructions  BEFORE YOU LEAVE: -labs -follow up: yearly for physical and as needed  Vit D3 [phone] IU daily  We have ordered labs or studies at this visit. It can take up to 1-2 weeks for results and processing. IF results require follow up or explanation, we will call you with instructions. Clinically stable results will be released to your [facility]. If you have not heard from [place] or cannot find your results in [facility] in 2 weeks please contact our office at [phone]. If you are not yet signed up for [facility], please SIGN UP TODAY. We now offer online scheduling, same day appointments and extended hours. WHEN YOU DON'T FEEL YOUR BEST.Marland KitchenMarland KitchenWE ARE HERE TO HELP.   We recommend the following healthy lifestyle for LIFE: 1) Small portions.   Tip: eat off of a salad plate instead of a dinner plate.  Tip: if you need more or a snack choose fruits, veggies and/or a handful of nuts or seeds.  2) Eat a healthy clean diet.  * Tip: Avoid (less then 1 serving per week): processed foods, sweets, sweetened drinks, white starches (rice, flour, bread, potatoes, pasta, etc), red meat, fast foods, butter  *Tip: CHOOSE instead   * 5-9 servings per day of fresh or frozen fruits and vegetables (but not corn, potatoes, bananas, canned or dried fruit)   *nuts and seeds, beans   *olives and olive oil   *small portions of lean meats such as fish and white  chicken    *small portions of whole grains  3)Get at least 150 minutes of sweaty aerobic exercise per week.  4)Reduce stress - consider counseling, meditation and relaxation to balance other aspects of your life.  You can try zantac (over the counter) as needed for mild acid reflux. Follow up if worsening or not improving with lifestyle changes.   Food Choices for Gastroesophageal Reflux Disease, Adult When you have gastroesophageal reflux disease (GERD), the foods you eat and your eating habits are very important. Choosing the right foods can help ease your discomfort. What guidelines do I need to follow?  Choose fruits, vegetables, whole grains, and low-fat dairy products.  Choose low-fat meat, fish, and poultry.  Limit fats such as oils, salad dressings, butter, nuts, and avocado.  Keep a  food diary. This helps you identify foods that cause symptoms.  Avoid foods that cause symptoms. These may be different for everyone.  Eat small meals often instead of 3 large meals a day.  Eat your meals slowly, in a place where you are relaxed.  Limit fried foods.  Cook foods using methods other than frying.  Avoid drinking alcohol.  Avoid drinking large amounts of liquids with your meals.  Avoid bending over or lying down until 2-3 hours after eating. What foods are not recommended? These are some foods and drinks that may make your symptoms worse: Vegetables  Tomatoes. Tomato juice. Tomato and spaghetti sauce. Chili peppers. Onion and garlic. Horseradish. Fruits  Oranges, grapefruit, and lemon (fruit and juice). Meats  High-fat meats, fish, and poultry. This includes hot dogs, ribs, ham, sausage, salami, and bacon. Dairy  Whole milk and chocolate milk. Sour cream. Cream. Butter. Ice cream. Cream cheese. Drinks  Coffee and tea. Bubbly (carbonated) drinks or energy drinks. Condiments  Hot sauce. Barbecue sauce. Sweets/Desserts  Chocolate and cocoa. Donuts. Peppermint and  spearmint. Fats and Oils  High-fat foods. This includes [place] fries and potato chips. Other  Vinegar. Strong spices. This includes black pepper, white pepper, red pepper, cayenne, curry powder, cloves, ginger, and chili powder. The items listed above may not be a complete list of foods and drinks to avoid. Contact your dietitian for more information.  This information is not intended to replace advice given to you by your health care provider. Make sure you discuss any questions you have with your health care provider. Document Released: 03/29/2012 Document Revised: 03/05/2016 Document Reviewed: 08/02/2013 Elsevier Interactive Patient Education  2017 [employer].            No Follow-up on file.  Kriste Basque R., DO

## 2016-11-12 ENCOUNTER — Encounter: Payer: Self-pay | Admitting: Family Medicine

## 2016-11-12 ENCOUNTER — Ambulatory Visit (INDEPENDENT_AMBULATORY_CARE_PROVIDER_SITE_OTHER): Payer: BLUE CROSS/BLUE SHIELD | Admitting: Family Medicine

## 2016-11-12 VITALS — BP 108/62 | HR 78 | Temp 98.5°F | Ht 63.25 in | Wt 176.2 lb

## 2016-11-12 DIAGNOSIS — K219 Gastro-esophageal reflux disease without esophagitis: Secondary | ICD-10-CM | POA: Diagnosis not present

## 2016-11-12 DIAGNOSIS — Z0000 Encounter for general adult medical examination without abnormal findings: Secondary | ICD-10-CM

## 2016-11-12 LAB — CHOLESTEROL, TOTAL: CHOLESTEROL: 215 mg/dL — AB (ref 0–200)

## 2016-11-12 LAB — HDL CHOLESTEROL: HDL: 53 mg/dL (ref >39.00)

## 2016-11-12 LAB — HEMOGLOBIN A1C: Hgb A1c MFr Bld: 5.6 % (ref 4.6–6.5)

## 2016-11-12 NOTE — Patient Instructions (Addendum)
BEFORE YOU LEAVE: -labs -follow up: yearly for physical and as needed  Vit D3 [phone] IU daily  We have ordered labs or studies at this visit. It can take up to 1-2 weeks for results and processing. IF results require follow up or explanation, we will call you with instructions. Clinically stable results will be released to your [facility]. If you have not heard from [place] or cannot find your results in [facility] in 2 weeks please contact our office at [phone]. If you are not yet signed up for [facility], please SIGN UP TODAY. We now offer online scheduling, same day appointments and extended hours. WHEN YOU DON'T FEEL YOUR BEST.Marland KitchenMarland KitchenWE ARE HERE TO HELP.   We recommend the following healthy lifestyle for LIFE: 1) Small portions.   Tip: eat off of a salad plate instead of a dinner plate.  Tip: if you need more or a snack choose fruits, veggies and/or a handful of nuts or seeds.  2) Eat a healthy clean diet.  * Tip: Avoid (less then 1 serving per week): processed foods, sweets, sweetened drinks, white starches (rice, flour, bread, potatoes, pasta, etc), red meat, fast foods, butter  *Tip: CHOOSE instead   * 5-9 servings per day of fresh or frozen fruits and vegetables (but not corn, potatoes, bananas, canned or dried fruit)   *nuts and seeds, beans   *olives and olive oil   *small portions of lean meats such as fish and white chicken    *small portions of whole grains  3)Get at least 150 minutes of sweaty aerobic exercise per week.  4)Reduce stress - consider counseling, meditation and relaxation to balance other aspects of your life.  You can try zantac (over the counter) as needed for mild acid reflux. Follow up if worsening or not improving with lifestyle changes.   Food Choices for Gastroesophageal Reflux Disease, Adult When you have gastroesophageal reflux disease (GERD), the foods you eat and your eating habits are very important. Choosing the right foods can help ease your  discomfort. What guidelines do I need to follow?  Choose fruits, vegetables, whole grains, and low-fat dairy products.  Choose low-fat meat, fish, and poultry.  Limit fats such as oils, salad dressings, butter, nuts, and avocado.  Keep a food diary. This helps you identify foods that cause symptoms.  Avoid foods that cause symptoms. These may be different for everyone.  Eat small meals often instead of 3 large meals a day.  Eat your meals slowly, in a place where you are relaxed.  Limit fried foods.  Cook foods using methods other than frying.  Avoid drinking alcohol.  Avoid drinking large amounts of liquids with your meals.  Avoid bending over or lying down until 2-3 hours after eating. What foods are not recommended? These are some foods and drinks that may make your symptoms worse: Vegetables  Tomatoes. Tomato juice. Tomato and spaghetti sauce. Chili peppers. Onion and garlic. Horseradish. Fruits  Oranges, grapefruit, and lemon (fruit and juice). Meats  High-fat meats, fish, and poultry. This includes hot dogs, ribs, ham, sausage, salami, and bacon. Dairy  Whole milk and chocolate milk. Sour cream. Cream. Butter. Ice cream. Cream cheese. Drinks  Coffee and tea. Bubbly (carbonated) drinks or energy drinks. Condiments  Hot sauce. Barbecue sauce. Sweets/Desserts  Chocolate and cocoa. Donuts. Peppermint and spearmint. Fats and Oils  High-fat foods. This includes [place] fries and potato chips. Other  Vinegar. Strong spices. This includes black pepper, white pepper, red pepper, cayenne, curry powder, cloves,  ginger, and chili powder. The items listed above may not be a complete list of foods and drinks to avoid. Contact your dietitian for more information.  This information is not intended to replace advice given to you by your health care provider. Make sure you discuss any questions you have with your health care provider. Document Released: 03/29/2012 Document Revised:  03/05/2016 Document Reviewed: 08/02/2013 Elsevier Interactive Patient Education  2017 [employer]

## 2016-11-12 NOTE — Progress Notes (Signed)
Pre visit review using our clinic review tool, if applicable. No additional management support is needed unless otherwise documented below in the visit note. 

## 2016-12-18 DIAGNOSIS — Z01419 Encounter for gynecological examination (general) (routine) without abnormal findings: Secondary | ICD-10-CM | POA: Diagnosis not present

## 2016-12-18 DIAGNOSIS — N898 Other specified noninflammatory disorders of vagina: Secondary | ICD-10-CM | POA: Diagnosis not present

## 2016-12-18 DIAGNOSIS — Z6831 Body mass index (BMI) 31.0-31.9, adult: Secondary | ICD-10-CM | POA: Diagnosis not present

## 2017-05-22 ENCOUNTER — Telehealth: Payer: BLUE CROSS/BLUE SHIELD | Admitting: Nurse Practitioner

## 2017-05-22 DIAGNOSIS — B373 Candidiasis of vulva and vagina: Secondary | ICD-10-CM

## 2017-05-22 DIAGNOSIS — B3731 Acute candidiasis of vulva and vagina: Secondary | ICD-10-CM

## 2017-05-22 MED ORDER — FLUCONAZOLE 150 MG PO TABS: ORAL_TABLET | ORAL | 0 refills | Status: DC

## 2017-05-22 NOTE — Progress Notes (Signed)
We are sorry that you are not feeling well. Here is how we plan to help! Based on what you shared with me it looks like you: May have a yeast vaginosis  Vaginosis is an inflammation of the vagina that can result in discharge, itching and pain. The cause is usually a change in the normal balance of vaginal bacteria or an infection. Vaginosis can also result from reduced estrogen levels after menopause.  The most common causes of vaginosis are:   Bacterial vaginosis which results from an overgrowth of one on several organisms that are normally present in your vagina.   Yeast infections which are caused by a naturally occurring fungus called candida.   Vaginal atrophy (atrophic vaginosis) which results from the thinning of the vagina from reduced estrogen levels after menopause.   Trichomoniasis which is caused by a parasite and is commonly transmitted by sexual intercourse.  Factors that increase your risk of developing vaginosis include: Marland Kitchen. Medications, such as antibiotics and steroids . Uncontrolled diabetes . Use of hygiene products such as bubble bath, vaginal spray or vaginal deodorant . Douching . Wearing damp or tight-fitting clothing . Using an intrauterine device (IUD) for birth control . Hormonal changes, such as those associated with pregnancy, birth control pills or menopause . Sexual activity . Having a sexually transmitted infection  Your treatment plan is A single Diflucan (fluconazole) 150mg  tablet one now the repeat in 1 week..  I have electronically sent this prescription into the pharmacy that you have chosen.  Be sure to take all of the medication as directed. Stop taking any medication if you develop a rash, tongue swelling or shortness of breath. Mothers who are breast feeding should consider pumping and discarding their breast milk while on these antibiotics. However, there is no consensus that infant exposure at these doses would be harmful.  Remember that medication  creams can weaken latex condoms. Marland Kitchen.   HOME CARE:  Good hygiene may prevent some types of vaginosis from recurring and may relieve some symptoms:  . Avoid baths, hot tubs and whirlpool spas. Rinse soap from your outer genital area after a shower, and dry the area well to prevent irritation. Don't use scented or harsh soaps, such as those with deodorant or antibacterial action. Marland Kitchen. Avoid irritants. These include scented tampons and pads. . Wipe from front to back after using the toilet. Doing so avoids spreading fecal bacteria to your vagina.  Other things that may help prevent vaginosis include:  Marland Kitchen. Don't douche. Your vagina doesn't require cleansing other than normal bathing. Repetitive douching disrupts the normal organisms that reside in the vagina and can actually increase your risk of vaginal infection. Douching won't clear up a vaginal infection. . Use a latex condom. Both female and female latex condoms may help you avoid infections spread by sexual contact. . Wear cotton underwear. Also wear pantyhose with a cotton crotch. If you feel comfortable without it, skip wearing underwear to bed. Yeast thrives in Hilton Hotelsmoist environments Your symptoms should improve in the next day or two.  GET HELP RIGHT AWAY IF:  . You have pain in your lower abdomen ( pelvic area or over your ovaries) . You develop nausea or vomiting . You develop a fever . Your discharge changes or worsens . You have persistent pain with intercourse . You develop shortness of breath, a rapid pulse, or you faint.  These symptoms could be signs of problems or infections that need to be evaluated by a medical provider now.  MAKE SURE YOU    Understand these instructions.  Will watch your condition.  Will get help right away if you are not doing well or get worse.  Your e-visit answers were reviewed by a board certified advanced clinical practitioner to complete your personal care plan. Depending upon the condition, your  plan could have included both over the counter or prescription medications. Please review your pharmacy choice to make sure that you have choses a pharmacy that is open for you to pick up any needed prescription, Your safety is important to us. If you have drug allergies check your prescription carefully.   You can use MyChart to ask questions about today's visit, request a non-urgent call back, or ask for a work or school excuse for 24 hours related to this e-Visit. If it has been greater than 24 hours you will need to follow up with your provider, or enter a new e-Visit to address those concerns. You will get a MyChart message within the next two days asking about your experience. I hope that your e-visit has been valuable and will speed your recovery.  

## 2017-06-17 DIAGNOSIS — Z6830 Body mass index (BMI) 30.0-30.9, adult: Secondary | ICD-10-CM | POA: Diagnosis not present

## 2017-06-17 DIAGNOSIS — B373 Candidiasis of vulva and vagina: Secondary | ICD-10-CM | POA: Diagnosis not present

## 2017-06-28 DIAGNOSIS — Z6831 Body mass index (BMI) 31.0-31.9, adult: Secondary | ICD-10-CM | POA: Diagnosis not present

## 2017-06-28 DIAGNOSIS — N760 Acute vaginitis: Secondary | ICD-10-CM | POA: Diagnosis not present

## 2017-07-01 ENCOUNTER — Encounter: Payer: Self-pay | Admitting: Family Medicine

## 2017-07-01 DIAGNOSIS — B079 Viral wart, unspecified: Secondary | ICD-10-CM | POA: Diagnosis not present

## 2017-07-21 DIAGNOSIS — N760 Acute vaginitis: Secondary | ICD-10-CM | POA: Diagnosis not present

## 2017-07-21 DIAGNOSIS — Z6831 Body mass index (BMI) 31.0-31.9, adult: Secondary | ICD-10-CM | POA: Diagnosis not present

## 2017-07-23 DIAGNOSIS — M47816 Spondylosis without myelopathy or radiculopathy, lumbar region: Secondary | ICD-10-CM | POA: Diagnosis not present

## 2017-07-23 DIAGNOSIS — M25511 Pain in right shoulder: Secondary | ICD-10-CM | POA: Diagnosis not present

## 2017-08-06 DIAGNOSIS — M47816 Spondylosis without myelopathy or radiculopathy, lumbar region: Secondary | ICD-10-CM | POA: Diagnosis not present

## 2017-08-06 DIAGNOSIS — M25511 Pain in right shoulder: Secondary | ICD-10-CM | POA: Diagnosis not present

## 2017-08-19 DIAGNOSIS — Z23 Encounter for immunization: Secondary | ICD-10-CM | POA: Diagnosis not present

## 2017-10-21 ENCOUNTER — Encounter: Payer: Self-pay | Admitting: Family Medicine

## 2017-11-25 ENCOUNTER — Encounter: Payer: Self-pay | Admitting: Family Medicine

## 2017-12-03 DIAGNOSIS — L570 Actinic keratosis: Secondary | ICD-10-CM | POA: Diagnosis not present

## 2017-12-03 DIAGNOSIS — L739 Follicular disorder, unspecified: Secondary | ICD-10-CM | POA: Diagnosis not present

## 2017-12-09 ENCOUNTER — Encounter: Payer: Self-pay | Admitting: Family Medicine

## 2017-12-09 DIAGNOSIS — L570 Actinic keratosis: Secondary | ICD-10-CM | POA: Diagnosis not present

## 2017-12-23 ENCOUNTER — Encounter: Payer: Self-pay | Admitting: Family Medicine

## 2017-12-23 ENCOUNTER — Ambulatory Visit (INDEPENDENT_AMBULATORY_CARE_PROVIDER_SITE_OTHER): Payer: BLUE CROSS/BLUE SHIELD | Admitting: Family Medicine

## 2017-12-23 VITALS — BP 90/60 | HR 54 | Temp 97.8°F | Ht 64.0 in | Wt 178.8 lb

## 2017-12-23 DIAGNOSIS — E785 Hyperlipidemia, unspecified: Secondary | ICD-10-CM

## 2017-12-23 DIAGNOSIS — M546 Pain in thoracic spine: Secondary | ICD-10-CM | POA: Diagnosis not present

## 2017-12-23 DIAGNOSIS — Z0000 Encounter for general adult medical examination without abnormal findings: Secondary | ICD-10-CM | POA: Diagnosis not present

## 2017-12-23 DIAGNOSIS — Z1331 Encounter for screening for depression: Secondary | ICD-10-CM

## 2017-12-23 DIAGNOSIS — Z6830 Body mass index (BMI) 30.0-30.9, adult: Secondary | ICD-10-CM

## 2017-12-23 NOTE — Progress Notes (Signed)
HPI:  Using dictation device. Unfortunately this device frequently misinterprets words/phrases.  Here for CPE: Due for pap? And labs -reports will be seeing her gynecologist for her Pap smear and GYN exam, mammogram is already scheduled. Reports overall is doing well without any true complaints.  She has had a little upper right thoracic back pain on and off.  Can't think of inciting event.  No weakness or numbness.  History of cervical degenerative disc disease. Sees dermatologist for skin exams. Lars Pinks at [facility].  PMH HLD and Obesity. report   -Diet: variety of foods, balance and well rounded, larger portion sizes -Exercise: no regular exercise -Taking folic acid, vitamin D or calcium: no -Diabetes and Dyslipidemia Screening: not fasting today, prefers to return for fasting labs -Vaccines: see vaccine section EPIC -pap history: sees gyn, M[facility] Mis -FDLMP: see nursing notes -sexual activity: yes, female partner, no new partners -wants STI testing (Hep C if born 35-65): no -FH breast, colon or ovarian ca: see FH Last mammogram: scheduled Last colon cancer screening: n/a Breast Ca Risk Assessment: see family history and pt history DEXA (>/= 65): n/a  -Alcohol, Tobacco, drug use: see social history  Review of Systems - no fevers, unintentional weight loss, vision loss, hearing loss, chest pain, sob, hemoptysis, melena, hematochezia, hematuria, genital discharge, changing or concerning skin lesions, bleeding, bruising, loc, thoughts of self harm or SI  Past Medical History:  Diagnosis Date  . Allergy   . Blood in stool    hemorrhoids  . DDD (degenerative disc disease), cervical    s/p surgery in 2016 with Dr. Trenton Gammon  . GERD (gastroesophageal reflux disease)   . History of chicken pox   . Kidney stones   . Migraines     Past Surgical History:  Procedure Laterality Date  . CERVICAL SPINE SURGERY  02/2015   Dr Delford Field patient  . CESAREAN SECTION    .  ENDOVENOUS ABLATION SAPHENOUS VEIN W/ LASER Left   . TUBAL LIGATION      Family History  Problem Relation Age of Onset  . Alcoholism Maternal Grandfather   . Heart disease Paternal Grandfather   . Kidney disease Mother   . Mental illness Father     Social History   Socioeconomic History  . Marital status: Married    Spouse name: None  . Number of children: None  . Years of education: None  . Highest education level: None  Social Needs  . Financial resource strain: None  . Food insecurity - worry: None  . Food insecurity - inability: None  . Transportation needs - medical: None  . Transportation needs - non-medical: None  Occupational History  . None  Tobacco Use  . Smoking status: Never Smoker  . Smokeless tobacco: Never Used  Substance and Sexual Activity  . Alcohol use: None  . Drug use: None  . Sexual activity: None  Other Topics Concern  . None  Social History Narrative   Work or School: homemaker      Home Situation: 4 children      Spiritual Beliefs: Christian       Lifestyle: No exercise currently        Current Outpatient Medications:  .  cyclobenzaprine (FLEXERIL) 5 MG tablet, Take 1 tablet (5 mg total) by mouth at bedtime as needed for muscle spasms., Disp: 20 tablet, Rfl: 0 .  fluconazole (DIFLUCAN) 150 MG tablet, 1 po now then repeat in 1 week, Disp: 2 tablet, Rfl: 0 .  [facility] 1/35 1-35 MG-MCG tablet, Take 1 tablet by mouth daily., Disp: , Rfl: 3 .  Loratadine (CLARITIN PO), Take by mouth., Disp: , Rfl:   EXAM:  Vitals:   12/23/17 1306  BP: 90/60  Pulse: (!) 54  Temp: 97.8 F (36.6 C)   Body mass index is 30.69 kg/m.  GENERAL: vitals reviewed and listed below, alert, oriented, appears well hydrated and in no acute distress  HEENT: head atraumatic, PERRLA, normal appearance of eyes, ears, nose and mouth. moist mucus membranes.  NECK: supple, no masses or lymphadenopathy  LUNGS: clear to auscultation bilaterally, no rales, rhonchi or  wheeze  CV: HRRR, no peripheral edema or cyanosis, normal pedal pulses  ABDOMEN: bowel sounds normal, soft, non tender to palpation, no masses, no rebound or guarding  GU/BREAST: declined, does with gyn  SKIN: no rash or abnormal lesions  MS: normal gait, moves all extremities normally, mild tenderness to palpation in right thoracic paraspinal musculature, no bony tenderness to palpation, normal functioning of the upper extremities, mild head and shoulders forward posture, T4 through 8 rotated right side bent left  NEURO: normal gait, speech and thought processing grossly intact, muscle tone grossly intact throughout  PSYCH: normal affect, pleasant and cooperative  ASSESSMENT AND PLAN:  Discussed the following assessment and plan:  PREVENTIVE EXAM: -Discussed and advised all [place] preventive services health task force level A and B recommendations for age, sex and risks. -Advised at least 150 minutes of exercise per week and a healthy diet -details in handout -labs per orders -seeing gyn for breast, gyn, pap exams, reports mammogram scheduled  2. Screening for depression Negative  3. Right-sided thoracic back pain, unspecified chronicity Is a start with home exercises, postural exercises -Advised follow-up in 3-4 weeks if not improving or resolved, consider imaging, PT and/or OMT and or other treatments if persists  4. Hyperlipidemia, unspecified hyperlipidemia type -She plans to schedule fasting lab visit  5. BMI 30.0-30.9,adult -Lifestyle recommendations, see patient handout-   Patient advised to return to clinic immediately if symptoms worsen or persist or new concerns.  Patient Instructions  BEFORE YOU LEAVE: -upper back exercises -follow up:  1) schedule fasting lab visit in next 1-2 weeks 2) physical in 1 year (follow up in 1 month if back issues persist)  Good posture. Upper back exercises 4 days per week. Follow up if back issues persist in 3-4 weeks. Sooner if  worsening.   Preventive Care 40-64 Years, Female Preventive care refers to lifestyle choices and visits with your health care provider that can promote health and wellness. What does preventive care include?  A yearly physical exam. This is also called an annual well check.  Dental exams once or twice a year.  Routine eye exams. Ask your health care provider how often you should have your eyes checked.  Personal lifestyle choices, including: ? Daily care of your teeth and gums. ? Regular physical activity. ? Eating a healthy diet. ? Avoiding tobacco and drug use. ? Limiting alcohol use. ? Practicing safe sex. ? Taking low-dose aspirin daily starting at age 58. ? Taking vitamin and mineral supplements as recommended by your health care provider. What happens during an annual well check? The services and screenings done by your health care provider during your annual well check will depend on your age, overall health, lifestyle risk factors, and family history of disease. Counseling Your health care provider may ask you questions about your:  Alcohol use.  Tobacco use.  Drug use.  Emotional well-being.  Home and relationship well-being.  Sexual activity.  Eating habits.  Work and work Statistician.  Method of birth control.  Menstrual cycle.  Pregnancy history.  Screening You may have the following tests or measurements:  Height, weight, and BMI.  Blood pressure.  Lipid and cholesterol levels. These may be checked every 5 years, or more frequently if you are over 52 years old.  Skin check.  Lung cancer screening. You may have this screening every year starting at age 74 if you have a 30-pack-year history of smoking and currently smoke or have quit within the past 15 years.  Fecal occult blood test (FOBT) of the stool. You may have this test every year starting at age 33.  Flexible sigmoidoscopy or colonoscopy. You may have a sigmoidoscopy every 5 years or  a colonoscopy every 10 years starting at age 36.  Hepatitis C blood test.  Hepatitis B blood test.  Sexually transmitted disease (STD) testing.  Diabetes screening. This is done by checking your blood sugar (glucose) after you have not eaten for a while (fasting). You may have this done every 1-3 years.  Mammogram. This may be done every 1-2 years. Talk to your health care provider about when you should start having regular mammograms. This may depend on whether you have a family history of breast cancer.  BRCA-related cancer screening. This may be done if you have a family history of breast, ovarian, tubal, or peritoneal cancers.  Pelvic exam and Pap test. This may be done every 3 years starting at age 25. Starting at age 55, this may be done every 5 years if you have a Pap test in combination with an HPV test.  Bone density scan. This is done to screen for osteoporosis. You may have this scan if you are at high risk for osteoporosis.  Discuss your test results, treatment options, and if necessary, the need for more tests with your health care provider. Vaccines Your health care provider may recommend certain vaccines, such as:  Influenza vaccine. This is recommended every year.  Tetanus, diphtheria, and acellular pertussis (Tdap, Td) vaccine. You may need a Td booster every 10 years.  Varicella vaccine. You may need this if you have not been vaccinated.  Zoster vaccine. You may need this after age 51.  Measles, mumps, and rubella (MMR) vaccine. You may need at least one dose of MMR if you were born in 1957 or later. You may also need a second dose.  Pneumococcal 13-valent conjugate (PCV13) vaccine. You may need this if you have certain conditions and were not previously vaccinated.  Pneumococcal polysaccharide (PPSV23) vaccine. You may need one or two doses if you smoke cigarettes or if you have certain conditions.  Meningococcal vaccine. You may need this if you have certain  conditions.  Hepatitis A vaccine. You may need this if you have certain conditions or if you travel or work in places where you may be exposed to hepatitis A.  Hepatitis B vaccine. You may need this if you have certain conditions or if you travel or work in places where you may be exposed to hepatitis B.  Haemophilus influenzae type b (Hib) vaccine. You may need this if you have certain conditions.  Talk to your health care provider about which screenings and vaccines you need and how often you need them. This information is not intended to replace advice given to you by your health care provider. Make sure you discuss any questions you have  with your health care provider. Document Released: 10/25/2015 Document Revised: 06/17/2016 Document Reviewed: 07/30/2015 Elsevier Interactive Patient Education  2018 [employer].     No Follow-up on file.  Lucretia Kern, DO

## 2017-12-23 NOTE — Patient Instructions (Signed)
BEFORE YOU LEAVE: -upper back exercises -follow up:  1) schedule fasting lab visit in next 1-2 weeks 2) physical in 1 year (follow up in 1 month if back issues persist)  Good posture. Upper back exercises 4 days per week. Follow up if back issues persist in 3-4 weeks. Sooner if worsening.   Preventive Care 40-64 Years, Female Preventive care refers to lifestyle choices and visits with your health care provider that can promote health and wellness. What does preventive care include?  A yearly physical exam. This is also called an annual well check.  Dental exams once or twice a year.  Routine eye exams. Ask your health care provider how often you should have your eyes checked.  Personal lifestyle choices, including: ? Daily care of your teeth and gums. ? Regular physical activity. ? Eating a healthy diet. ? Avoiding tobacco and drug use. ? Limiting alcohol use. ? Practicing safe sex. ? Taking low-dose aspirin daily starting at age 33. ? Taking vitamin and mineral supplements as recommended by your health care provider. What happens during an annual well check? The services and screenings done by your health care provider during your annual well check will depend on your age, overall health, lifestyle risk factors, and family history of disease. Counseling Your health care provider may ask you questions about your:  Alcohol use.  Tobacco use.  Drug use.  Emotional well-being.  Home and relationship well-being.  Sexual activity.  Eating habits.  Work and work Statistician.  Method of birth control.  Menstrual cycle.  Pregnancy history.  Screening You may have the following tests or measurements:  Height, weight, and BMI.  Blood pressure.  Lipid and cholesterol levels. These may be checked every 5 years, or more frequently if you are over 51 years old.  Skin check.  Lung cancer screening. You may have this screening every year starting at age 70 if you  have a 30-pack-year history of smoking and currently smoke or have quit within the past 15 years.  Fecal occult blood test (FOBT) of the stool. You may have this test every year starting at age 9.  Flexible sigmoidoscopy or colonoscopy. You may have a sigmoidoscopy every 5 years or a colonoscopy every 10 years starting at age 18.  Hepatitis C blood test.  Hepatitis B blood test.  Sexually transmitted disease (STD) testing.  Diabetes screening. This is done by checking your blood sugar (glucose) after you have not eaten for a while (fasting). You may have this done every 1-3 years.  Mammogram. This may be done every 1-2 years. Talk to your health care provider about when you should start having regular mammograms. This may depend on whether you have a family history of breast cancer.  BRCA-related cancer screening. This may be done if you have a family history of breast, ovarian, tubal, or peritoneal cancers.  Pelvic exam and Pap test. This may be done every 3 years starting at age 62. Starting at age 71, this may be done every 5 years if you have a Pap test in combination with an HPV test.  Bone density scan. This is done to screen for osteoporosis. You may have this scan if you are at high risk for osteoporosis.  Discuss your test results, treatment options, and if necessary, the need for more tests with your health care provider. Vaccines Your health care provider may recommend certain vaccines, such as:  Influenza vaccine. This is recommended every year.  Tetanus, diphtheria, and acellular pertussis (  Tdap, Td) vaccine. You may need a Td booster every 10 years.  Varicella vaccine. You may need this if you have not been vaccinated.  Zoster vaccine. You may need this after age 83.  Measles, mumps, and rubella (MMR) vaccine. You may need at least one dose of MMR if you were born in 1957 or later. You may also need a second dose.  Pneumococcal 13-valent conjugate (PCV13) vaccine.  You may need this if you have certain conditions and were not previously vaccinated.  Pneumococcal polysaccharide (PPSV23) vaccine. You may need one or two doses if you smoke cigarettes or if you have certain conditions.  Meningococcal vaccine. You may need this if you have certain conditions.  Hepatitis A vaccine. You may need this if you have certain conditions or if you travel or work in places where you may be exposed to hepatitis A.  Hepatitis B vaccine. You may need this if you have certain conditions or if you travel or work in places where you may be exposed to hepatitis B.  Haemophilus influenzae type b (Hib) vaccine. You may need this if you have certain conditions.  Talk to your health care provider about which screenings and vaccines you need and how often you need them. This information is not intended to replace advice given to you by your health care provider. Make sure you discuss any questions you have with your health care provider. Document Released: 10/25/2015 Document Revised: 06/17/2016 Document Reviewed: 07/30/2015 Elsevier Interactive Patient Education  [employer].

## 2017-12-28 NOTE — Addendum Note (Signed)
Addended by: Terressa KoyanagiKIM, Huldah Marin R on: 12/28/2017 09:59 AM   Modules accepted: Orders

## 2017-12-29 ENCOUNTER — Other Ambulatory Visit (INDEPENDENT_AMBULATORY_CARE_PROVIDER_SITE_OTHER): Payer: BLUE CROSS/BLUE SHIELD

## 2017-12-29 DIAGNOSIS — E785 Hyperlipidemia, unspecified: Secondary | ICD-10-CM | POA: Diagnosis not present

## 2017-12-29 DIAGNOSIS — Z6830 Body mass index (BMI) 30.0-30.9, adult: Secondary | ICD-10-CM

## 2017-12-29 LAB — HEMOGLOBIN A1C: Hgb A1c MFr Bld: 5.6 % (ref 4.6–6.5)

## 2017-12-29 LAB — LIPID PANEL
Cholesterol: 201 mg/dL — ABNORMAL HIGH (ref 0–200)
HDL: 55.1 mg/dL (ref >39.00)
LDL CALC: 109 mg/dL — AB (ref 0–99)
NONHDL: 145.71
Total CHOL/HDL Ratio: 4
Triglycerides: 186 mg/dL — ABNORMAL HIGH (ref 0.0–149.0)
VLDL: 37.2 mg/dL (ref 0.0–40.0)

## 2017-12-30 DIAGNOSIS — Z124 Encounter for screening for malignant neoplasm of cervix: Secondary | ICD-10-CM | POA: Diagnosis not present

## 2017-12-30 DIAGNOSIS — Z6832 Body mass index (BMI) 32.0-32.9, adult: Secondary | ICD-10-CM | POA: Diagnosis not present

## 2017-12-30 DIAGNOSIS — Z1231 Encounter for screening mammogram for malignant neoplasm of breast: Secondary | ICD-10-CM | POA: Diagnosis not present

## 2017-12-30 DIAGNOSIS — Z01419 Encounter for gynecological examination (general) (routine) without abnormal findings: Secondary | ICD-10-CM | POA: Diagnosis not present

## 2017-12-30 DIAGNOSIS — R87612 Low grade squamous intraepithelial lesion on cytologic smear of cervix (LGSIL): Secondary | ICD-10-CM | POA: Diagnosis not present

## 2017-12-30 LAB — HM PAP SMEAR

## 2018-02-18 DIAGNOSIS — L570 Actinic keratosis: Secondary | ICD-10-CM | POA: Diagnosis not present

## 2018-02-28 DIAGNOSIS — M542 Cervicalgia: Secondary | ICD-10-CM | POA: Diagnosis not present

## 2018-03-03 DIAGNOSIS — M5030 Other cervical disc degeneration, unspecified cervical region: Secondary | ICD-10-CM | POA: Diagnosis not present

## 2018-03-24 DIAGNOSIS — L570 Actinic keratosis: Secondary | ICD-10-CM | POA: Diagnosis not present

## 2018-03-24 DIAGNOSIS — Z6832 Body mass index (BMI) 32.0-32.9, adult: Secondary | ICD-10-CM | POA: Diagnosis not present

## 2018-03-24 DIAGNOSIS — M5412 Radiculopathy, cervical region: Secondary | ICD-10-CM | POA: Diagnosis not present

## 2018-03-24 DIAGNOSIS — B079 Viral wart, unspecified: Secondary | ICD-10-CM | POA: Diagnosis not present

## 2018-04-27 DIAGNOSIS — Z6832 Body mass index (BMI) 32.0-32.9, adult: Secondary | ICD-10-CM | POA: Diagnosis not present

## 2018-04-27 DIAGNOSIS — M5412 Radiculopathy, cervical region: Secondary | ICD-10-CM | POA: Diagnosis not present

## 2018-04-27 DIAGNOSIS — M4802 Spinal stenosis, cervical region: Secondary | ICD-10-CM | POA: Diagnosis not present

## 2018-04-29 ENCOUNTER — Encounter: Payer: Self-pay | Admitting: Family Medicine

## 2018-05-05 DIAGNOSIS — M5136 Other intervertebral disc degeneration, lumbar region: Secondary | ICD-10-CM | POA: Diagnosis not present

## 2018-05-18 DIAGNOSIS — M5412 Radiculopathy, cervical region: Secondary | ICD-10-CM | POA: Diagnosis not present

## 2018-05-18 DIAGNOSIS — Z6831 Body mass index (BMI) 31.0-31.9, adult: Secondary | ICD-10-CM | POA: Diagnosis not present

## 2018-06-17 DIAGNOSIS — M4802 Spinal stenosis, cervical region: Secondary | ICD-10-CM | POA: Diagnosis not present

## 2018-06-17 DIAGNOSIS — M5412 Radiculopathy, cervical region: Secondary | ICD-10-CM | POA: Diagnosis not present

## 2018-06-17 DIAGNOSIS — Z6831 Body mass index (BMI) 31.0-31.9, adult: Secondary | ICD-10-CM | POA: Diagnosis not present

## 2018-08-04 DIAGNOSIS — L570 Actinic keratosis: Secondary | ICD-10-CM | POA: Diagnosis not present

## 2018-08-04 DIAGNOSIS — C44319 Basal cell carcinoma of skin of other parts of face: Secondary | ICD-10-CM | POA: Diagnosis not present

## 2018-10-01 ENCOUNTER — Telehealth: Payer: BLUE CROSS/BLUE SHIELD | Admitting: Physician Assistant

## 2018-10-01 DIAGNOSIS — B373 Candidiasis of vulva and vagina: Secondary | ICD-10-CM

## 2018-10-01 DIAGNOSIS — B3731 Acute candidiasis of vulva and vagina: Secondary | ICD-10-CM

## 2018-10-01 MED ORDER — FLUCONAZOLE 150 MG PO TABS: ORAL_TABLET | ORAL | 0 refills | Status: AC

## 2018-10-01 NOTE — Progress Notes (Signed)

## 2018-10-06 DIAGNOSIS — C44319 Basal cell carcinoma of skin of other parts of face: Secondary | ICD-10-CM | POA: Diagnosis not present

## 2018-12-30 ENCOUNTER — Ambulatory Visit: Payer: BLUE CROSS/BLUE SHIELD | Admitting: Family Medicine

## 2019-01-03 DIAGNOSIS — L292 Pruritus vulvae: Secondary | ICD-10-CM | POA: Diagnosis not present

## 2019-01-03 DIAGNOSIS — Z6832 Body mass index (BMI) 32.0-32.9, adult: Secondary | ICD-10-CM | POA: Diagnosis not present

## 2019-01-03 DIAGNOSIS — Z01419 Encounter for gynecological examination (general) (routine) without abnormal findings: Secondary | ICD-10-CM | POA: Diagnosis not present

## 2019-01-03 DIAGNOSIS — Z1231 Encounter for screening mammogram for malignant neoplasm of breast: Secondary | ICD-10-CM | POA: Diagnosis not present

## 2019-01-03 DIAGNOSIS — Z124 Encounter for screening for malignant neoplasm of cervix: Secondary | ICD-10-CM | POA: Diagnosis not present

## 2019-01-03 DIAGNOSIS — R87612 Low grade squamous intraepithelial lesion on cytologic smear of cervix (LGSIL): Secondary | ICD-10-CM | POA: Diagnosis not present

## 2019-01-05 ENCOUNTER — Encounter: Payer: BLUE CROSS/BLUE SHIELD | Admitting: Family Medicine

## 2019-01-09 ENCOUNTER — Ambulatory Visit: Payer: BLUE CROSS/BLUE SHIELD | Admitting: Family Medicine

## 2019-01-13 ENCOUNTER — Ambulatory Visit: Payer: BLUE CROSS/BLUE SHIELD | Admitting: Family Medicine

## 2019-02-03 ENCOUNTER — Ambulatory Visit: Payer: BLUE CROSS/BLUE SHIELD | Admitting: Family Medicine

## 2019-02-16 ENCOUNTER — Ambulatory Visit: Payer: BLUE CROSS/BLUE SHIELD | Admitting: Family Medicine

## 2019-02-23 ENCOUNTER — Ambulatory Visit: Payer: BLUE CROSS/BLUE SHIELD | Admitting: Family Medicine

## 2019-02-27 DIAGNOSIS — C44519 Basal cell carcinoma of skin of other part of trunk: Secondary | ICD-10-CM | POA: Diagnosis not present

## 2019-03-02 ENCOUNTER — Ambulatory Visit: Payer: BLUE CROSS/BLUE SHIELD | Admitting: Family Medicine

## 2019-03-02 DIAGNOSIS — Z6832 Body mass index (BMI) 32.0-32.9, adult: Secondary | ICD-10-CM | POA: Diagnosis not present

## 2019-03-02 DIAGNOSIS — R87612 Low grade squamous intraepithelial lesion on cytologic smear of cervix (LGSIL): Secondary | ICD-10-CM | POA: Diagnosis not present

## 2019-03-02 DIAGNOSIS — B977 Papillomavirus as the cause of diseases classified elsewhere: Secondary | ICD-10-CM | POA: Diagnosis not present

## 2019-03-17 ENCOUNTER — Ambulatory Visit: Payer: BLUE CROSS/BLUE SHIELD | Admitting: Family Medicine

## 2019-03-23 DIAGNOSIS — C44519 Basal cell carcinoma of skin of other part of trunk: Secondary | ICD-10-CM | POA: Diagnosis not present

## 2019-06-16 ENCOUNTER — Encounter: Payer: BLUE CROSS/BLUE SHIELD | Admitting: Family Medicine
